# Patient Record
Sex: Male | Born: 1995 | Hispanic: Yes | Marital: Single | Smoking: Never smoker
Health system: Southern US, Community
[De-identification: ages and names within clinical notes are randomized; demographics above are authoritative.]

---

## 2019-07-09 ENCOUNTER — Emergency Department (HOSPITAL_COMMUNITY): Payer: Managed Care, Other (non HMO)

## 2019-07-09 ENCOUNTER — Encounter (HOSPITAL_COMMUNITY): Payer: Self-pay

## 2019-07-09 ENCOUNTER — Other Ambulatory Visit: Payer: Self-pay

## 2019-07-09 ENCOUNTER — Emergency Department (HOSPITAL_COMMUNITY)
Admission: EM | Admit: 2019-07-09 | Discharge: 2019-07-09 | Disposition: A | Discharge status: Home / Self Care | Payer: Managed Care, Other (non HMO) | Attending: Emergency Medicine | Admitting: Emergency Medicine

## 2019-07-09 DIAGNOSIS — Y9389 Activity, other specified: Secondary | ICD-10-CM | POA: Diagnosis not present

## 2019-07-09 DIAGNOSIS — Y999 Unspecified external cause status: Secondary | ICD-10-CM | POA: Insufficient documentation

## 2019-07-09 DIAGNOSIS — S61212A Laceration without foreign body of right middle finger without damage to nail, initial encounter: Secondary | ICD-10-CM | POA: Insufficient documentation

## 2019-07-09 DIAGNOSIS — Y9289 Other specified places as the place of occurrence of the external cause: Secondary | ICD-10-CM | POA: Diagnosis not present

## 2019-07-09 DIAGNOSIS — W25XXXA Contact with sharp glass, initial encounter: Secondary | ICD-10-CM | POA: Insufficient documentation

## 2019-07-09 MED ORDER — BACITRACIN ZINC 500 UNIT/GM EX OINT: TOPICAL_OINTMENT | Freq: Once | CUTANEOUS | Status: AC

## 2019-07-09 MED ORDER — ACETAMINOPHEN 500 MG PO TABS
1000.0000 mg | ORAL_TABLET | Freq: Once | ORAL | Status: AC
  Administered 2019-07-09: 20:00:00 1000 mg via ORAL
  Filled 2019-07-09: qty 2

## 2019-07-09 MED ORDER — BUPIVACAINE HCL (PF) 0.5 % IJ SOLN
INTRAMUSCULAR | Status: AC
  Filled 2019-07-09: qty 30

## 2019-07-09 MED ORDER — BACITRACIN ZINC 500 UNIT/GM EX OINT
TOPICAL_OINTMENT | CUTANEOUS | Status: AC
  Filled 2019-07-09: qty 1.8

## 2019-07-09 MED ORDER — BUPIVACAINE HCL 0.25 % IJ SOLN
10.0000 mL | Freq: Once | INTRAMUSCULAR | Status: DC
  Filled 2019-07-09 (×2): qty 10

## 2019-07-09 NOTE — ED Provider Notes (Signed)
[facility]   CSN: [id number] Arrival date & time: 07/09/19  1658     History Chief Complaint  Patient presents with  . finger laceration    Alex Brooks is a 24 y.o. male who presents for evaluation of laceration noted to his right third digit.  He reports that he was putting shelves away and states that the glass broke, lacerating his finger.  He states that he is not on any blood thinners.  He reports difficulty moving the finger secondary to pain.  He thinks his tetanus is up-to-date.  He denies any numbness/weakness.  The history is provided by the patient.       History reviewed. No pertinent past medical history.  There are no problems to display for this patient.   History reviewed. No pertinent surgical history.     Family History  Problem Relation Age of Onset  . Healthy Mother   . Healthy Father     Social History   Tobacco Use  . Smoking status: Never Smoker  . Smokeless tobacco: Never Used  Substance Use Topics  . Alcohol use: Never  . Drug use: Never    Home Medications Prior to Admission medications   Not on File    Allergies    Patient has no known allergies.  Review of Systems   Review of Systems  Skin: Positive for wound.  Neurological: Negative for weakness and numbness.  All other systems reviewed and are negative.   Physical Exam Updated Vital Signs BP 113/69   Pulse (!) 52   Temp 98.4 F (36.9 C) (Oral)   Resp 17   Ht 5\' 2"  (1.575 m)   Wt 79.4 kg   SpO2 95%   BMI 32.01 kg/m   Physical Exam Vitals and nursing note reviewed.  Constitutional:      Appearance: He is well-developed.  HENT:     Head: Normocephalic and atraumatic.  Eyes:     General: No scleral icterus.       Right eye: No discharge.        Left eye: No discharge.     Conjunctiva/sclera: Conjunctivae normal.  Cardiovascular:     Pulses:          Radial pulses are 2+ on the right side and 2+ on the  left side.  Pulmonary:     Effort: Pulmonary effort is normal.  Musculoskeletal:     Comments: Tenderness to palpation noted to the volar aspect of the right third digit where the laceration is.  He can easily extend all 5 digits 90 difficulty can easily flex them and make a fist.  DIP and PIP flexion/extension of the right third digit fully intact when held in isolation.   Skin:    General: Skin is warm and dry.     Comments: 2.5 cm diagonal laceration that begins at the lateral aspect of the nailbed.  It does not involve the nail.  It extends diagonally across the volar aspect and does cross over the DIP.  Scattered lacerations noted circumferentially around his left forearm.  He has 1 skin abrasion noted to the distal forearm as well as a small skin avulsion noted to the mid forearm.  Neurological:     Mental Status: He is alert.     Comments: Sensation intact along major nerve distributions of BUE  Psychiatric:        Speech: Speech normal.        Behavior: Behavior  normal.     ED Results / Procedures / Treatments   Labs (all labs ordered are listed, but only abnormal results are displayed) Labs Reviewed - No data to display  EKG None  Radiology DG Finger Middle Right  Result Date: 07/09/2019 CLINICAL DATA:  Laceration and to the right middle finger. EXAM: RIGHT MIDDLE FINGER 2+V COMPARISON:  None. FINDINGS: There is no evidence of fracture or dislocation. There is no evidence of arthropathy. Benign bone island in the proximal phalanx. No radiodense foreign body in the soft tissues. IMPRESSION: 1. Negative. 2. No radiopaque foreign body. Electronically Signed   By: Francene Boyers M.D.   On: 07/09/2019 18:37    Procedures .Marland KitchenLaceration Repair  Date/Time: 07/09/2019 8:25 PM Performed by: Maxwell Caul, PA-C Authorized by: Maxwell Caul, PA-C   Consent:    Consent obtained:  Verbal   Consent given by:  Patient   Risks discussed:  Infection, need for additional repair,  pain, poor cosmetic result and poor wound healing   Alternatives discussed:  No treatment and delayed treatment Universal protocol:    Procedure explained and questions answered to patient or proxy's satisfaction: yes     Relevant documents present and verified: yes     Test results available and properly labeled: yes     Imaging studies available: yes     Required blood products, implants, devices, and special equipment available: yes     Site/side marked: yes     Immediately prior to procedure, a time out was called: yes     Patient identity confirmed:  Verbally with patient Anesthesia (see MAR for exact dosages):    Anesthesia method:  Nerve block   Block needle gauge:  25 G   Block anesthetic:  Bupivacaine 0.25% w/o epi   Block injection procedure:  Anatomic landmarks identified, introduced needle, incremental injection and negative aspiration for blood Laceration details:    Location:  Finger   Finger location:  R long finger   Length (cm):  2.5 Repair type:    Repair type:  Intermediate Pre-procedure details:    Preparation:  Patient was prepped and draped in usual sterile fashion Exploration:    Hemostasis achieved with:  Direct pressure   Wound exploration: wound explored through full range of motion     Wound extent: no foreign bodies/material noted and no tendon damage noted   Treatment:    Wound cleansed with: Chloraprep.   Amount of cleaning:  Extensive   Irrigation solution:  Sterile saline   Irrigation method:  Syringe   Visualized foreign bodies/material removed: no   Skin repair:    Repair method:  Sutures   Suture size:  4-0   Suture material:  Nylon   Suture technique:  Simple interrupted   Number of sutures:  8 Approximation:    Approximation:  Close Post-procedure details:    Dressing:  Antibiotic ointment   Patient tolerance of procedure:  Tolerated well, no immediate complications   (including critical care time)  Medications Ordered in  ED Medications  bupivacaine (MARCAINE) 0.25 % (with pres) injection 10 mL (has no administration in time range)  bupivacaine (MARCAINE) 0.5 % injection (  Given by Other 07/09/19 2023)  acetaminophen (TYLENOL) tablet 1,000 mg (1,000 mg Oral Given 07/09/19 2023)  bacitracin ointment ( Topical Given 07/09/19 2023)    ED Course  I have reviewed the triage vital signs and the nursing notes.  Pertinent labs & imaging results that were available during my care  of the patient were reviewed by me and considered in my medical decision making (see chart for details).    MDM Rules/Calculators/A&P                       24 year old male who presents for evaluation of laceration to right middle finger that occurred just prior to ED arrival.  No blood thinners.  He thinks his tetanus is up-to-date.  On initial arrival, he is afebrile, nontoxic-appearing.  Vital signs are stable.  He is neurovascularly intact.  He has full range of motion without any difficulty.  Do not suspect tendon injury.  X-ray reviewed.  Negative for any acute bony abnormality.  No evidence of foreign body.  Laceration repaired as documented above.  The area was thoroughly and extensively irrigated with sterile saline.  Full evaluation showed no evidence of foreign body or glass.  I cannot see any tendon involvement he still had full flexion/tension of DIP without any difficulty.  Additionally, cleaned all of his wounds on his left forearm.  He had a small abrasion that was not amenable to I&D.  No evidence of foreign body.  Additionally, he did have a small skin avulsion but did not identify any evidence of glass or foreign body.  I did offer to numb the areas to further look but patient declined.  Encouraged at home supportive care measures. At this time, patient exhibits no emergent life-threatening condition that require further evaluation in ED or admission. Patient had ample opportunity for questions and discussion. All patient's  questions were answered with full understanding. Strict return precautions discussed. Patient expresses understanding and agreement to plan.   Portions of this note were generated with [employer]. Dictation errors may occur despite best attempts at proofreading.  Final Clinical Impression(s) / ED Diagnoses Final diagnoses:  Laceration of right middle finger without foreign body without damage to nail, initial encounter    Rx / DC Orders ED Discharge Orders    None       Volanda Napoleon, PA-C 07/10/19 0054    Margette Fast, MD 07/10/19 1019

## 2019-07-09 NOTE — Discharge Instructions (Addendum)
Keep the wound clean and dry for the first 24 hours. After that you may gently clean the wound with soap and water. Make sure to pat dry the wound before covering it with any dressing. You can use topical antibiotic ointment and bandage. Ice and elevate for pain relief.   You can take Tylenol or Ibuprofen as directed for pain. You can alternate Tylenol and Ibuprofen every 4 hours for additional pain relief.   Follow-up with hand for any further complications or difficulty moving hand or numbness.  Return to the Emergency Department, your primary care doctor, or the [facility] in 7-10 days for suture removal.   Monitor closely for any signs of infection. Return to the Emergency Department for any worsening redness/swelling of the area that begins to spread, drainage from the site, worsening pain, fever or any other worsening or concerning symptoms.

## 2019-07-09 NOTE — ED Triage Notes (Signed)
Patient states he was putting glass objects on a shelf and the glass and shelf fell on his right middle finger. Bleeding in triage.

## 2019-07-09 NOTE — ED Notes (Signed)
Bacitracin and dressing applied to wound. Splint applied to finger per PA request.

## 2019-07-23 ENCOUNTER — Other Ambulatory Visit: Payer: Self-pay

## 2019-07-23 ENCOUNTER — Encounter (HOSPITAL_COMMUNITY): Payer: Self-pay | Admitting: Emergency Medicine

## 2019-07-23 ENCOUNTER — Emergency Department (HOSPITAL_COMMUNITY)
Admission: EM | Admit: 2019-07-23 | Discharge: 2019-07-23 | Disposition: A | Discharge status: Home / Self Care | Payer: Managed Care, Other (non HMO) | Attending: Emergency Medicine | Admitting: Emergency Medicine

## 2019-07-23 DIAGNOSIS — Z4802 Encounter for removal of sutures: Secondary | ICD-10-CM | POA: Diagnosis not present

## 2019-07-23 DIAGNOSIS — S61212D Laceration without foreign body of right middle finger without damage to nail, subsequent encounter: Secondary | ICD-10-CM | POA: Diagnosis not present

## 2019-07-23 DIAGNOSIS — X58XXXD Exposure to other specified factors, subsequent encounter: Secondary | ICD-10-CM | POA: Diagnosis not present

## 2019-07-23 NOTE — ED Notes (Signed)
Suture removal kit at bedside. 

## 2019-07-23 NOTE — ED Triage Notes (Signed)
Pt reports got stitches placed two tuesdays ago and here to get out stitches. Denies any complications and or s/s of infection.

## 2019-07-23 NOTE — ED Provider Notes (Signed)
[facility]PT Provider Note   CSN: [id number] Arrival date & time: 07/23/19  5809     History Chief Complaint  Patient presents with  . Suture / Staple Removal    Alex Brooks is a 24 y.o. male.  HPI Patient presents to the emergency department with need for suture removal.  The patient states that he had the sutures placed almost 2 weeks ago.  The patient states that he had no complications from the suture site.  Patient states that there is been no drainage or redness around the area.    History reviewed. No pertinent past medical history.  There are no problems to display for this patient.   History reviewed. No pertinent surgical history.     Family History  Problem Relation Age of Onset  . Healthy Mother   . Healthy Father     Social History   Tobacco Use  . Smoking status: Never Smoker  . Smokeless tobacco: Never Used  Substance Use Topics  . Alcohol use: Never  . Drug use: Never    Home Medications Prior to Admission medications   Not on File    Allergies    Patient has no known allergies.  Review of Systems   Review of Systems All other systems negative except as documented in the HPI. All pertinent positives and negatives as reviewed in the HPI. Physical Exam Updated Vital Signs BP 113/79 (BP Location: Right Arm)   Pulse 84   Temp 98.4 F (36.9 C) (Oral)   Resp 18   SpO2 99%   Physical Exam Vitals and nursing note reviewed.  Constitutional:      General: He is not in acute distress.    Appearance: He is well-developed.  HENT:     Head: Normocephalic and atraumatic.  Eyes:     Pupils: Pupils are equal, round, and reactive to light.  Pulmonary:     Effort: Pulmonary effort is normal.  Musculoskeletal:       Hands:  Skin:    General: Skin is warm and dry.  Neurological:     Mental Status: He is alert and oriented to person, place, and time.     ED Results / Procedures / Treatments    Labs (all labs ordered are listed, but only abnormal results are displayed) Labs Reviewed - No data to display  EKG None  Radiology No results found.  Procedures Procedures (including critical care time)  Medications Ordered in ED Medications - No data to display  ED Course  I have reviewed the triage vital signs and the nursing notes.  Pertinent labs & imaging results that were available during my care of the patient were reviewed by me and considered in my medical decision making (see chart for details).    MDM Rules/Calculators/A&P                      SUTURE REMOVAL Performed by: Jamesetta Orleans Treven Holtman  Consent: Verbal consent obtained. Patient identity confirmed: provided demographic data Time out: Immediately prior to procedure a "time out" was called to verify the correct patient, procedure, equipment, support staff and site/side marked as required.  Location details: R middle finger  Wound Appearance: clean  Sutures/Staples Removed: 6  Facility: sutures placed in this facility Patient tolerance: Patient tolerated the procedure well with no immediate complications.   Given wound care instructions and scar reduction techniques.  Advised patient to return here as needed for any changes in  his condition.  Patient agrees the plan and all questions were answered. Final Clinical Impression(s) / ED Diagnoses Final diagnoses:  None    Rx / DC Orders ED Discharge Orders    None       Dalia Heading, PA-C 07/23/19 8921    Tegeler, Gwenyth Allegra, MD 07/23/19 [phone]

## 2019-07-23 NOTE — Discharge Instructions (Addendum)
Return here as needed.  You can gently massage the area to help break down some of the scar tissue.  I would also recommend scar reduction cream or vitamin E oil to help with scarring as well.

## 2020-02-14 IMAGING — DX DG FINGER MIDDLE 2+V*R*
3 series · 3 of 3 positions shown · non-contrast
Comparison: None.

CLINICAL DATA: Laceration and to the right middle finger.

EXAM:
RIGHT MIDDLE FINGER 2+V

[finger ap]
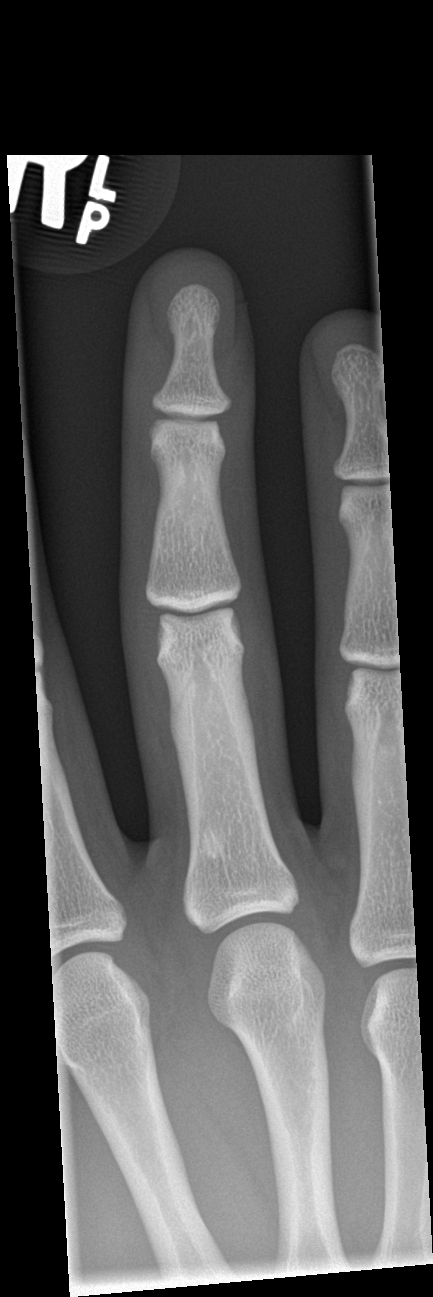

[finger obl]
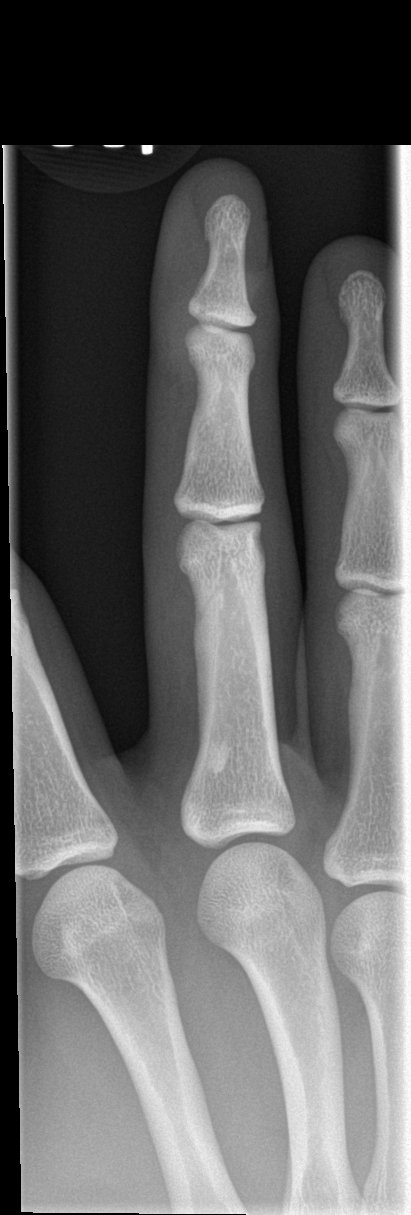

[finger lat]
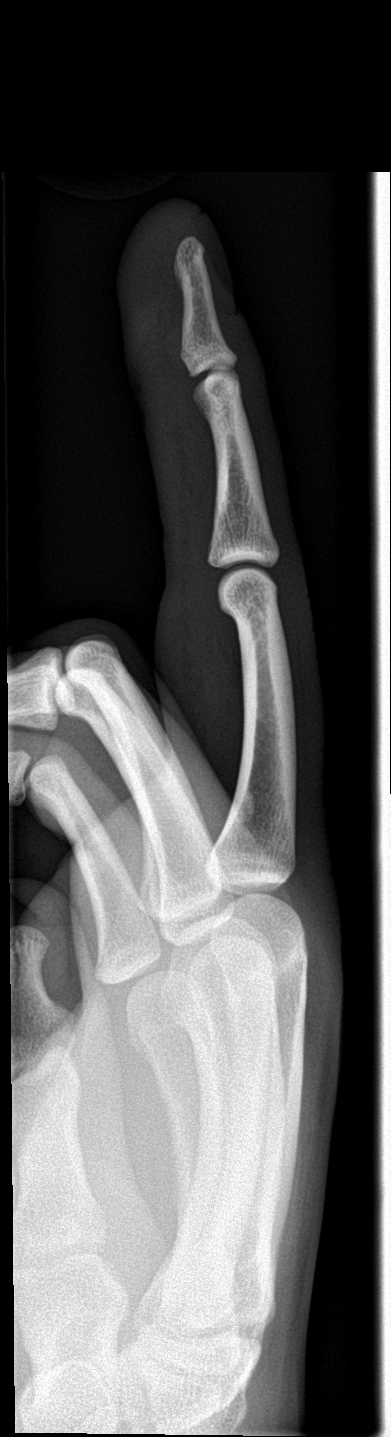

[3 of 3 positions shown; findings below may reference images not displayed]

FINDINGS: There is no evidence of fracture or dislocation. There is no
evidence of arthropathy. Benign bone island in the proximal phalanx.
No radiodense foreign body in the soft tissues.
IMPRESSION: 1. Negative.
2. No radiopaque foreign body.
